# Patient Record
Sex: Female | Born: 1980 | Race: White | Hispanic: No | Marital: Single | Smoking: Current every day smoker
Health system: Southern US, Community
[De-identification: ages and names within clinical notes are randomized; demographics above are authoritative.]

---

## 2014-02-18 DIAGNOSIS — R200 Anesthesia of skin: Secondary | ICD-10-CM | POA: Insufficient documentation

## 2014-02-18 DIAGNOSIS — R32 Unspecified urinary incontinence: Secondary | ICD-10-CM | POA: Insufficient documentation

## 2014-02-18 DIAGNOSIS — Z880 Allergy status to penicillin: Secondary | ICD-10-CM | POA: Insufficient documentation

## 2014-02-18 DIAGNOSIS — Z720 Tobacco use: Secondary | ICD-10-CM | POA: Insufficient documentation

## 2014-02-19 ENCOUNTER — Emergency Department (HOSPITAL_COMMUNITY)
Admission: EM | Admit: 2014-02-19 | Discharge: 2014-02-19 | Disposition: A | Discharge status: Home / Self Care | Payer: Self-pay | Attending: Emergency Medicine | Admitting: Emergency Medicine

## 2014-02-19 ENCOUNTER — Emergency Department (HOSPITAL_COMMUNITY): Payer: Self-pay

## 2014-02-19 ENCOUNTER — Encounter (HOSPITAL_COMMUNITY): Payer: Self-pay | Admitting: *Deleted

## 2014-02-19 DIAGNOSIS — M545 Low back pain, unspecified: Secondary | ICD-10-CM

## 2014-02-19 DIAGNOSIS — R32 Unspecified urinary incontinence: Secondary | ICD-10-CM

## 2014-02-19 MED ORDER — OXYCODONE-ACETAMINOPHEN 5-325 MG PO TABS: 1.0000 | ORAL_TABLET | Freq: Four times a day (QID) | ORAL | Status: AC | PRN

## 2014-02-19 MED ORDER — OXYCODONE-ACETAMINOPHEN 5-325 MG PO TABS
1.0000 | ORAL_TABLET | Freq: Once | ORAL | Status: AC
  Administered 2014-02-19: 1 via ORAL
  Filled 2014-02-19: qty 1

## 2014-02-19 MED ORDER — OXYCODONE-ACETAMINOPHEN 5-325 MG PO TABS: 1.0000 | ORAL_TABLET | Freq: Four times a day (QID) | ORAL | Status: DC | PRN

## 2014-02-19 MED ORDER — PREDNISONE 20 MG PO TABS: ORAL_TABLET | ORAL | Status: AC

## 2014-02-19 MED ORDER — PREDNISONE 20 MG PO TABS: ORAL_TABLET | ORAL | Status: DC

## 2014-02-19 MED ORDER — PREDNISONE 20 MG PO TABS
60.0000 mg | ORAL_TABLET | Freq: Once | ORAL | Status: AC
  Administered 2014-02-19: 60 mg via ORAL
  Filled 2014-02-19: qty 3

## 2014-02-19 NOTE — Discharge Instructions (Signed)
You have been give a copy of your MRI result  There is no significant cause for your symptoms noted on this study  Please make an appointment with your PCP for referral fro physical therapy You have been give a prescription for a long steroid taper and pain medication to help control your symptoms
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
## 2014-02-19 NOTE — ED Notes (Signed)
The pt is c/o lower back pain with thuigh numbness and feet numb that has started foir 24 hours with bladder control, loss lmp nione

## 2016-01-08 IMAGING — MR MR LUMBAR SPINE W/O CM
4 of 5 series · 19 of 48 positions shown · non-contrast
Comparison: None.

CLINICAL DATA: Low back pain with numbness and weakness in thighs
and feet. No injury. Urinary incontinence.

EXAM:
MRI LUMBAR SPINE WITHOUT CONTRAST
TECHNIQUE: Multiplanar, multisequence MR imaging of the lumbar spine was
performed. No intravenous contrast was administered.

[Series 4: T2 · sagittal · 4.0mm · 0.55mm/px · 6 of 13 slices shown (1 of 2)]
[im 1/13]
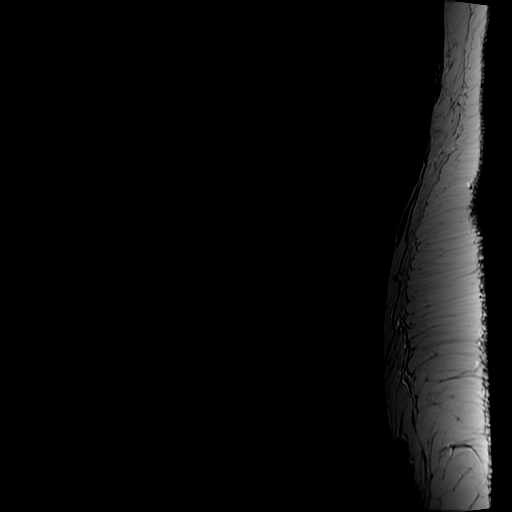
[im 3/13]
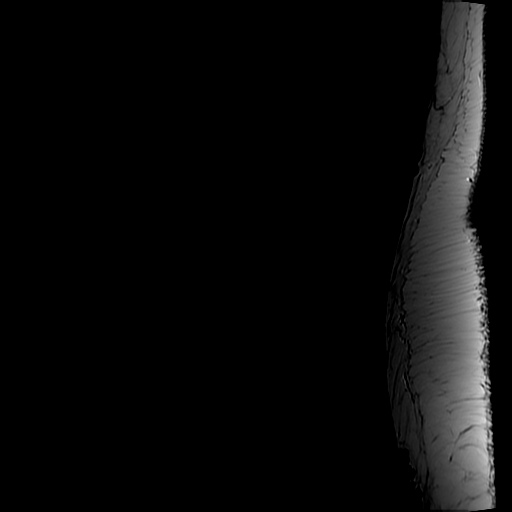
[im 5/13]
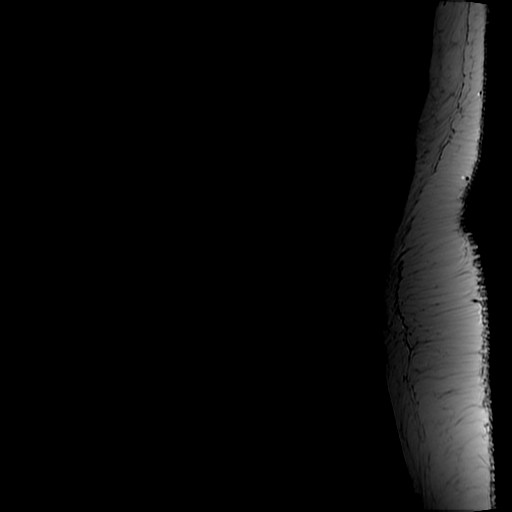
[im 8/13]
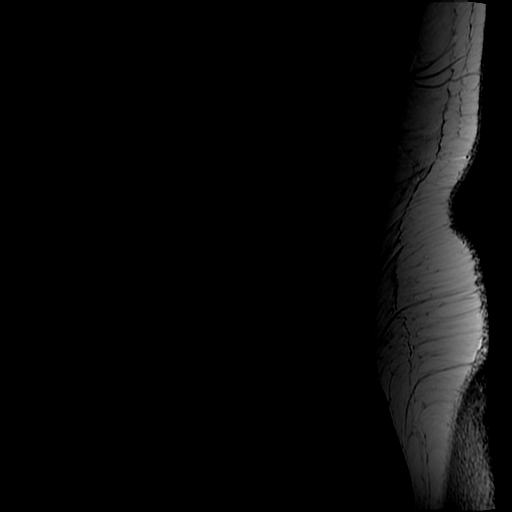
[im 10/13]
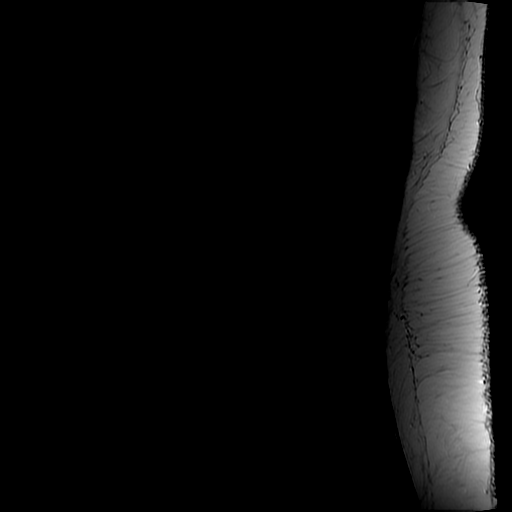
[im 13/13]
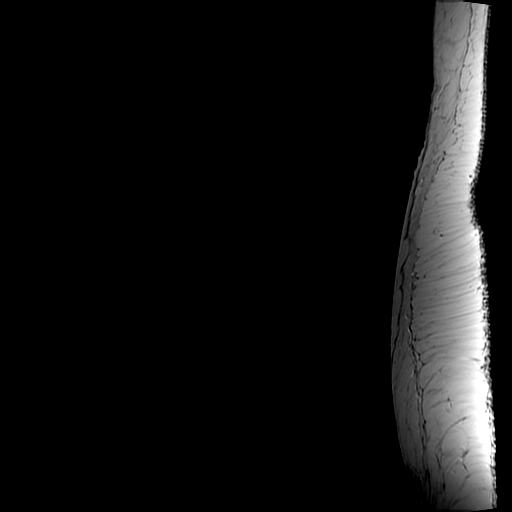

[Series 6: T1 · sagittal · 4.0mm · 0.55mm/px · 3 of 13 slices shown (1 of 2)]
[im 1/13]
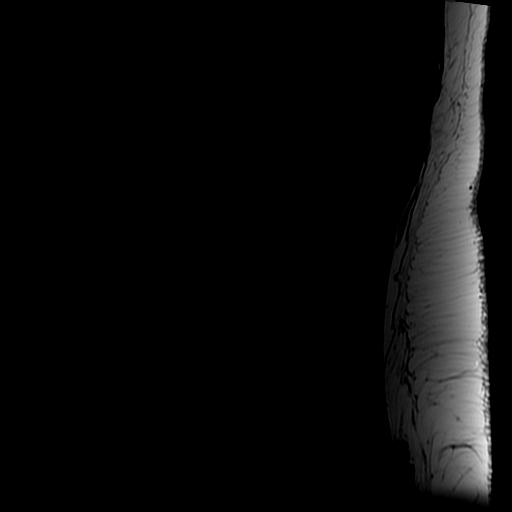
[im 7/13]
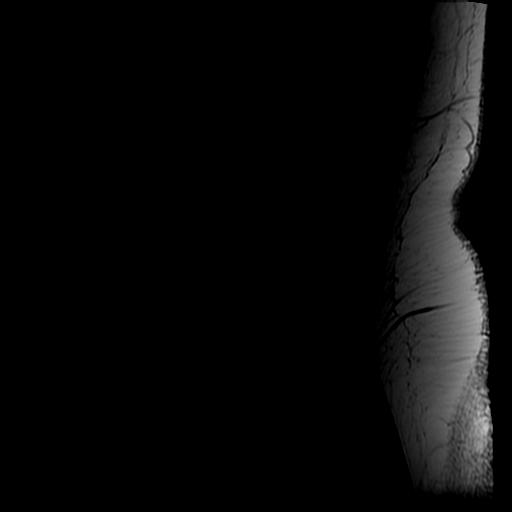
[im 13/13]
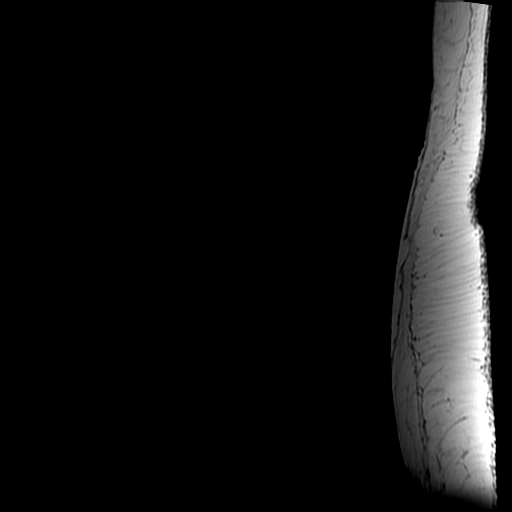

[Series 8: T2 · axial · 4.0mm · 0.39mm/px · z∈[-120,+75]mm · 7 of 40 slices shown (2 of 2)]
[im 3/40]
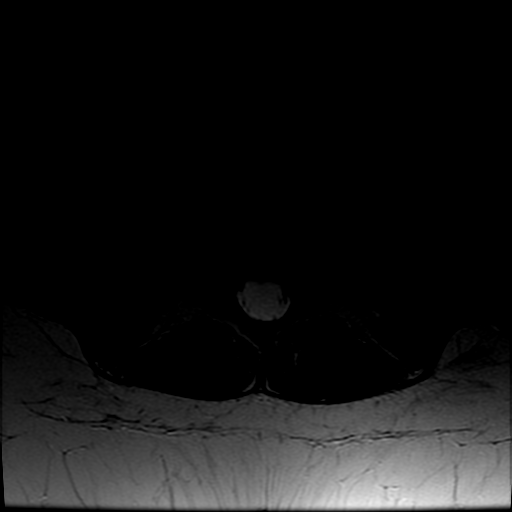
[im 6/40]
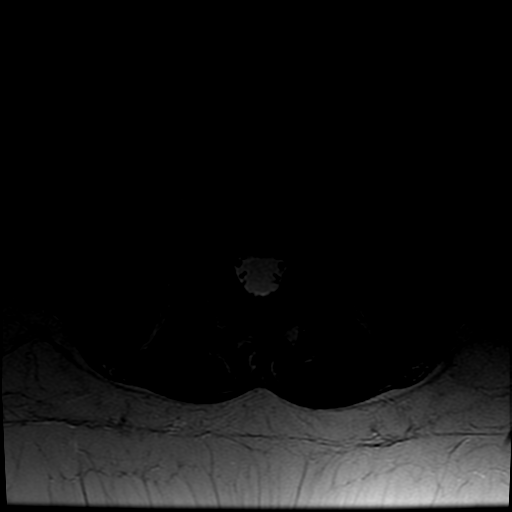
[im 8/40]
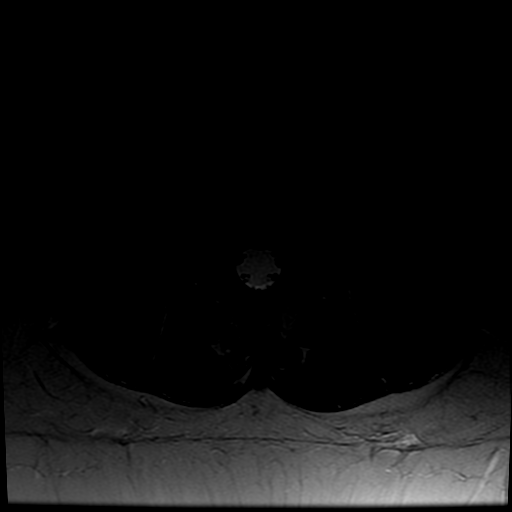
[im 14/40]
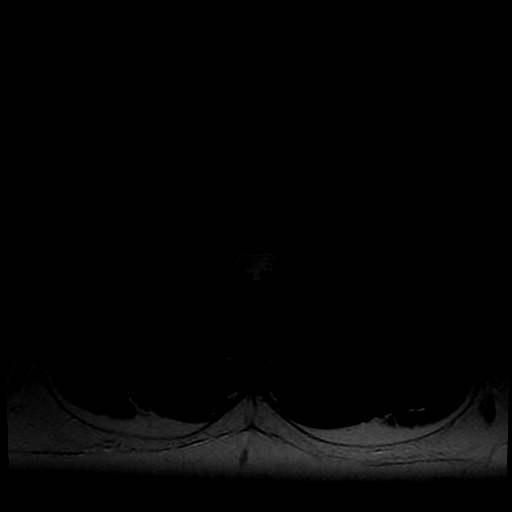
[im 19/40]
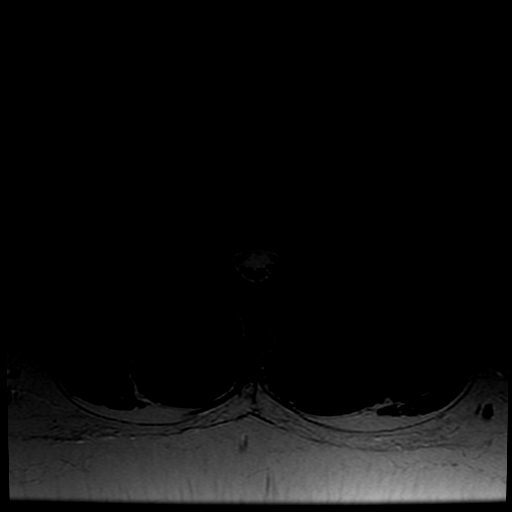
[im 21/40]
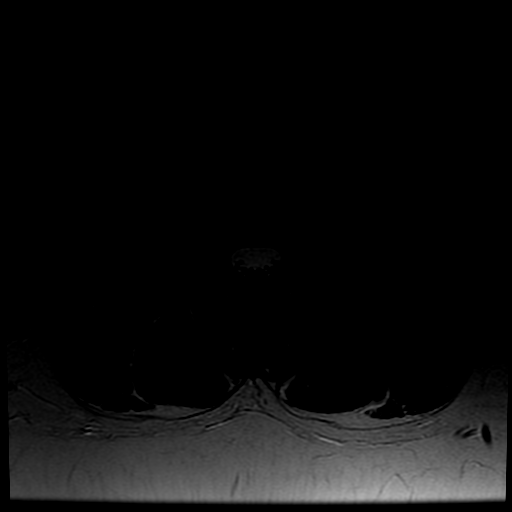
[im 34/40]
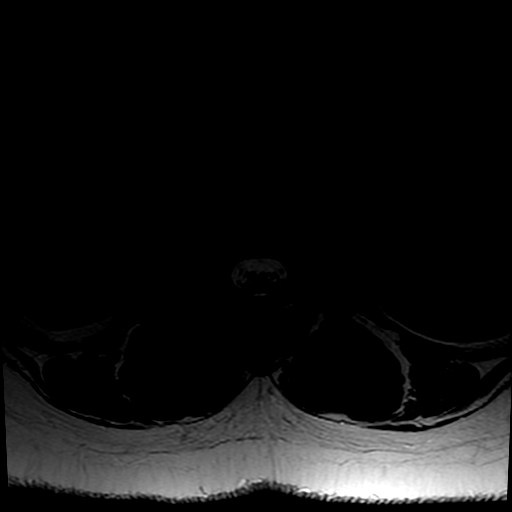

[Series 9: T1 · axial · 4.0mm · 0.39mm/px · z∈[-105,+75]mm · 3 of 40 slices shown (2 of 2)]
[im 6/40]
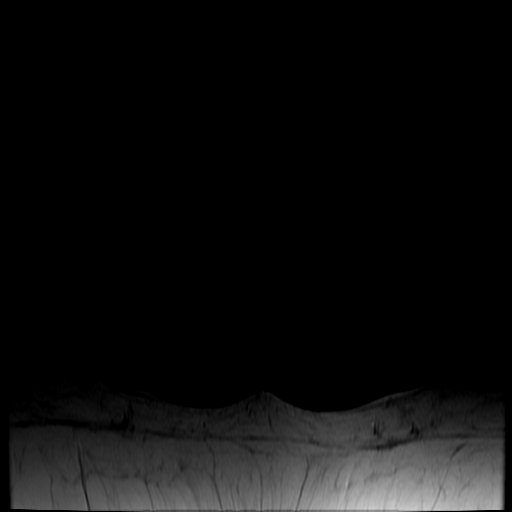
[im 21/40]
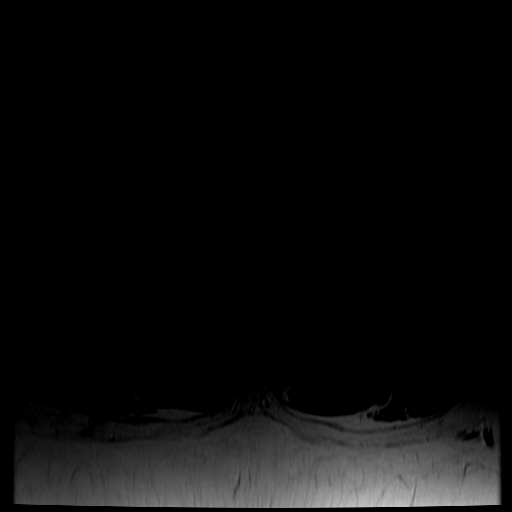
[im 34/40]
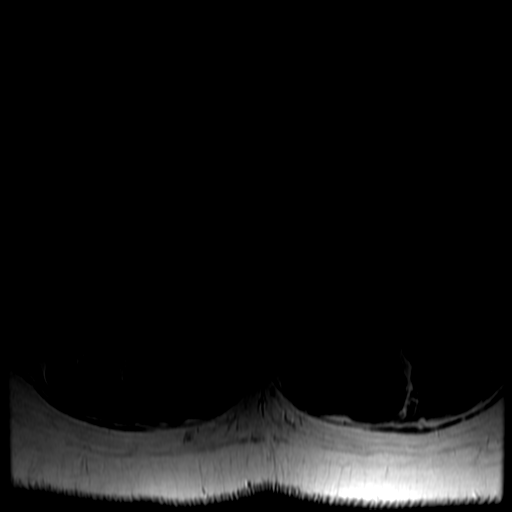

[19 of 48 positions shown; findings below may reference images not displayed]

FINDINGS: Lumbar vertebral bodies and posterior elements are intact and
aligned, maintenance of the lumbar lordosis. Intervertebral discs
demonstrate normal morphology and signal characteristics. The Mild
chronic discogenic endplate changes without STIR signal abnormality
to suggest acute osseous process.

Moderate T11-12 disc height loss, with disc desiccation and moderate
chronic discogenic endplate changes. Mild canal stenosis at T11-12.
Conus medullaris terminates at L1 appears normal in morphology and
signal characteristics. Cauda equina is unremarkable. Included
prevertebral and paraspinal soft tissues are normal. Subcentimeter
T2 hyperintensity within the RIGHT kidney likely represent cyst.

Level by level evaluation:

T12-L1, L1-2, L2-3, L3-4: No disc bulge, canal stenosis nor neural
foraminal narrowing.

L4-5: Minimal annular bulging and mild facet arthropathy and
ligamentum flavum redundancy. No canal stenosis. Mild RIGHT, minimal
LEFT neural foraminal narrowing.

L5-S1: Small broad-based disc bulge, mild facet arthropathy. No
canal stenosis. Mild bilateral neural foraminal narrowing.
IMPRESSION: Early degenerative change of the lumbar spine without acute fracture
or malalignment.

No canal stenosis. Minimal to mild L4-5 and L5-S1 neural foraminal
narrowing.

T11-12 degenerative change results in mild canal stenosis.

  By: J Michael Desjardins
